# Patient Record
Sex: Male | Born: 1955 | Race: White | Hispanic: No | Marital: Married | Smoking: Never smoker
Health system: Southern US, Community
[De-identification: ages and names within clinical notes are randomized; demographics above are authoritative.]

## PROBLEM LIST (undated history)

## (undated) DIAGNOSIS — K5190 Ulcerative colitis, unspecified, without complications: Secondary | ICD-10-CM

## (undated) DIAGNOSIS — J309 Allergic rhinitis, unspecified: Secondary | ICD-10-CM

## (undated) DIAGNOSIS — K2270 Barrett's esophagus without dysplasia: Secondary | ICD-10-CM

## (undated) DIAGNOSIS — G459 Transient cerebral ischemic attack, unspecified: Secondary | ICD-10-CM

## (undated) DIAGNOSIS — K219 Gastro-esophageal reflux disease without esophagitis: Secondary | ICD-10-CM

## (undated) DIAGNOSIS — E785 Hyperlipidemia, unspecified: Secondary | ICD-10-CM

## (undated) DIAGNOSIS — I10 Essential (primary) hypertension: Secondary | ICD-10-CM

## (undated) HISTORY — PX: VASECTOMY: SHX75

## (undated) HISTORY — PX: COLONOSCOPY: SHX174

## (undated) HISTORY — PX: TONSILLECTOMY: SUR1361

## (undated) HISTORY — PX: ESOPHAGOGASTRODUODENOSCOPY: SHX1529

## (undated) HISTORY — PX: ADENOIDECTOMY: SUR15

---

## 2004-04-27 ENCOUNTER — Ambulatory Visit: Payer: Self-pay | Admitting: Internal Medicine

## 2004-07-20 ENCOUNTER — Ambulatory Visit: Payer: Self-pay | Admitting: Internal Medicine

## 2004-09-23 ENCOUNTER — Ambulatory Visit: Payer: Self-pay | Admitting: Unknown Physician Specialty

## 2004-11-14 ENCOUNTER — Observation Stay: Payer: Self-pay | Admitting: Internal Medicine

## 2004-12-14 ENCOUNTER — Ambulatory Visit: Payer: Self-pay | Admitting: General Practice

## 2004-12-21 ENCOUNTER — Ambulatory Visit: Payer: Self-pay | Admitting: Internal Medicine

## 2005-01-02 ENCOUNTER — Encounter: Payer: Self-pay | Admitting: General Practice

## 2005-04-12 ENCOUNTER — Ambulatory Visit: Payer: Self-pay | Admitting: Internal Medicine

## 2006-02-27 ENCOUNTER — Ambulatory Visit: Payer: Self-pay | Admitting: Internal Medicine

## 2006-06-12 ENCOUNTER — Ambulatory Visit: Payer: Self-pay | Admitting: Internal Medicine

## 2006-07-17 ENCOUNTER — Ambulatory Visit: Payer: Self-pay | Admitting: Internal Medicine

## 2007-04-02 ENCOUNTER — Ambulatory Visit: Payer: Self-pay | Admitting: Internal Medicine

## 2007-04-17 ENCOUNTER — Ambulatory Visit: Payer: Self-pay | Admitting: Unknown Physician Specialty

## 2008-04-03 ENCOUNTER — Ambulatory Visit: Payer: Self-pay | Admitting: Unknown Physician Specialty

## 2009-07-02 ENCOUNTER — Ambulatory Visit: Payer: Self-pay | Admitting: Neurology

## 2009-07-29 ENCOUNTER — Ambulatory Visit: Payer: Self-pay | Admitting: Neurology

## 2010-01-20 ENCOUNTER — Ambulatory Visit: Payer: Self-pay | Admitting: Internal Medicine

## 2010-01-21 LAB — PSA

## 2010-03-14 ENCOUNTER — Ambulatory Visit: Payer: Self-pay | Admitting: Internal Medicine

## 2010-03-21 ENCOUNTER — Other Ambulatory Visit: Payer: Self-pay | Admitting: Internal Medicine

## 2010-03-22 LAB — PSA

## 2010-10-07 IMAGING — US US CAROTID DUPLEX BILAT
1 series · 17 of 24 positions shown · non-contrast
Comparison: none

REASON FOR EXAM: Frontal headache, numbness
COMMENTS:

[Series 1: us carotid duplex bilat · 17 of 62 slices shown]
[im 1/62]
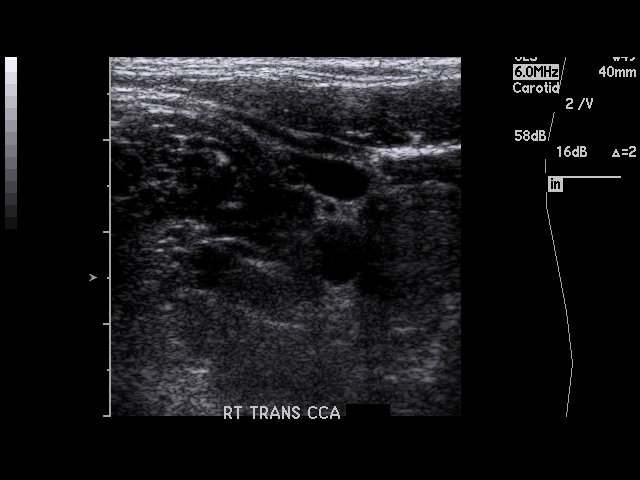
[im 6/62]
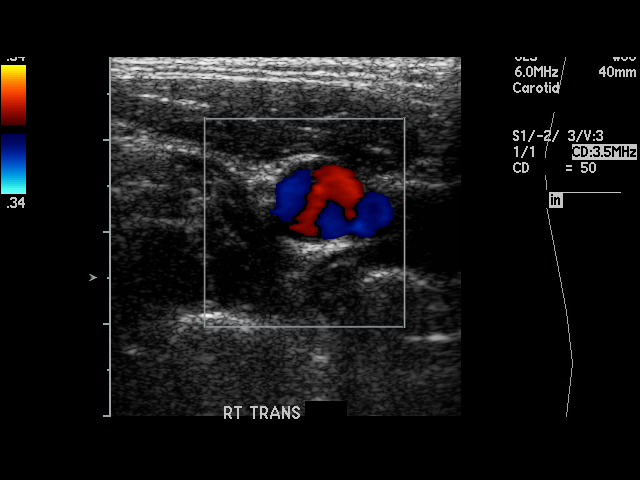
[im 8/62]
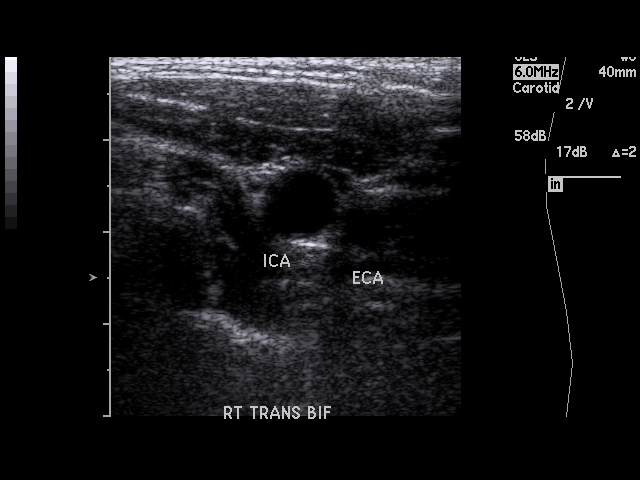
[im 11/62]
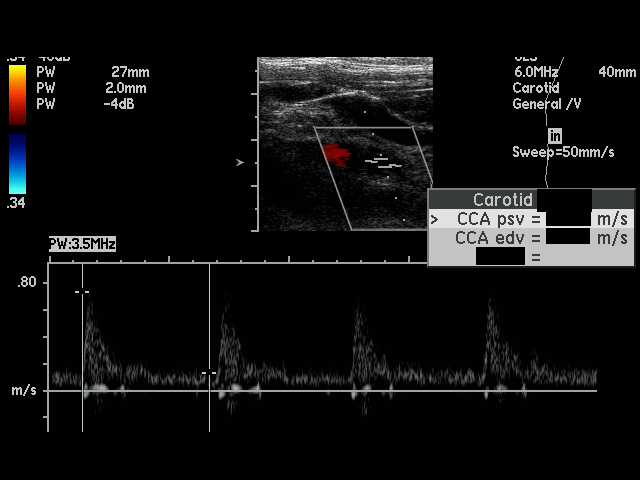
[im 16/62]
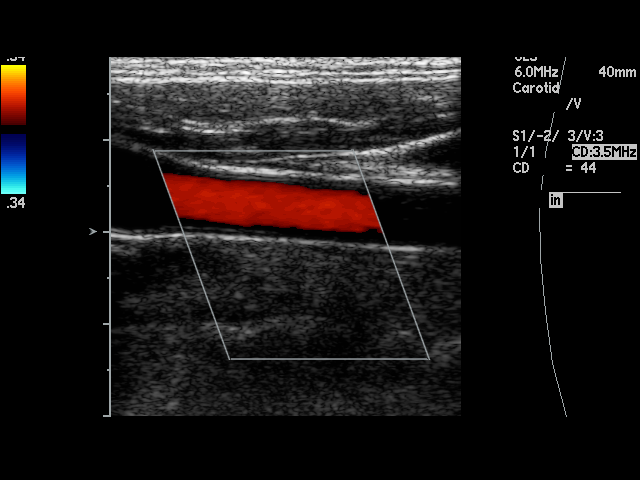
[im 19/62]
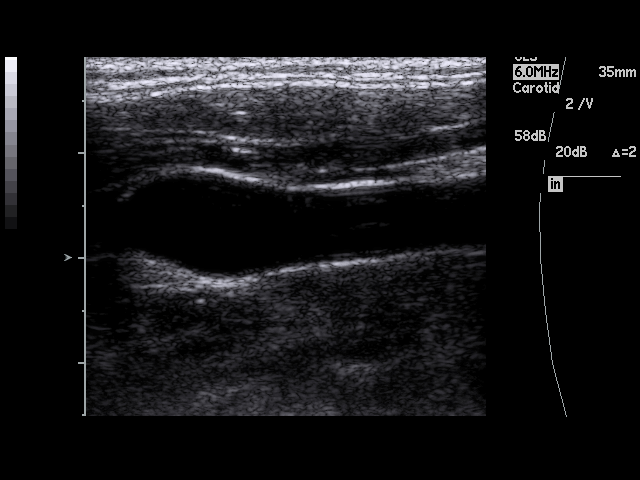
[im 24/62]
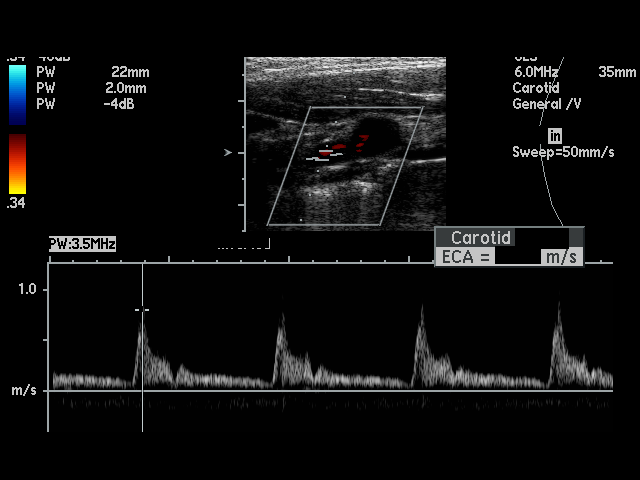
[im 27/62]
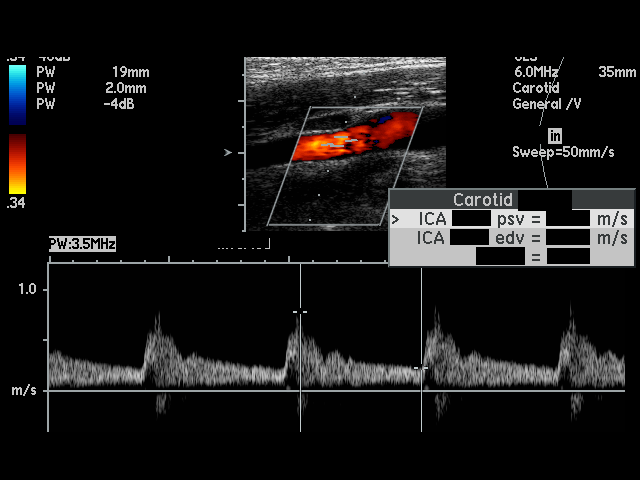
[im 32/62]
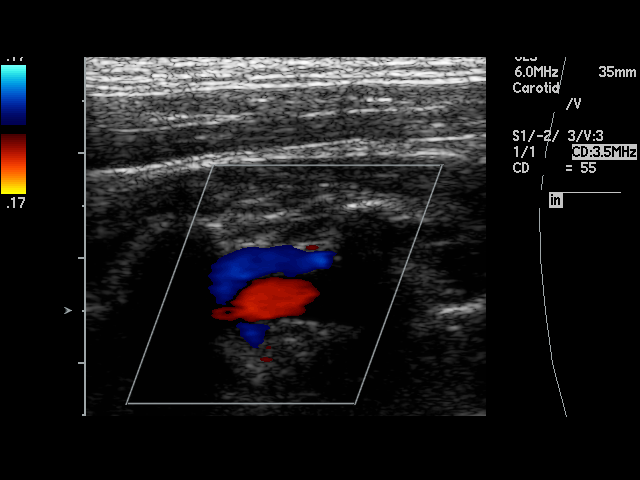
[im 35/62]
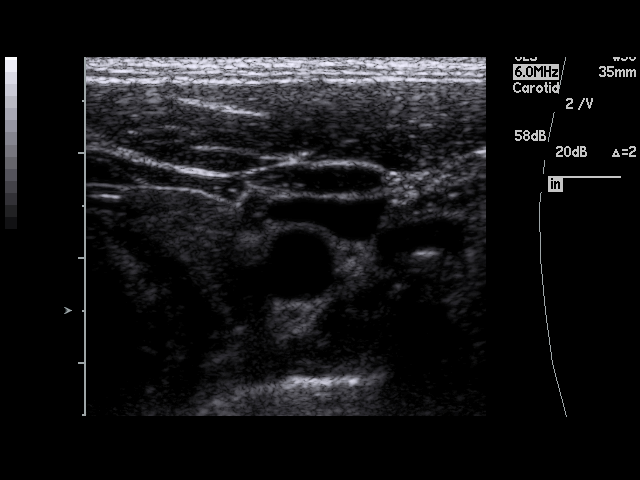
[im 38/62]
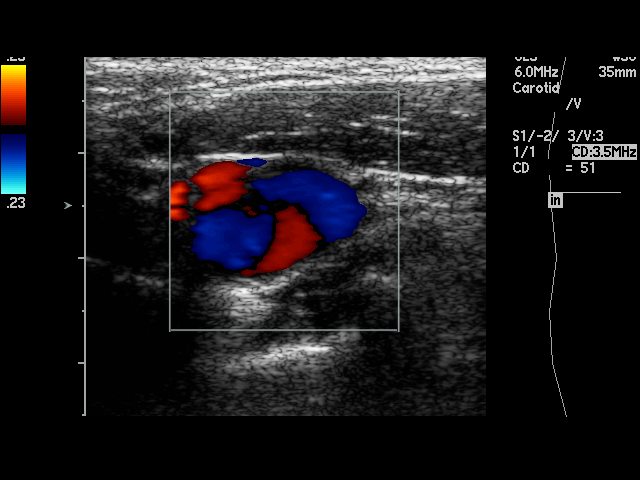
[im 43/62]
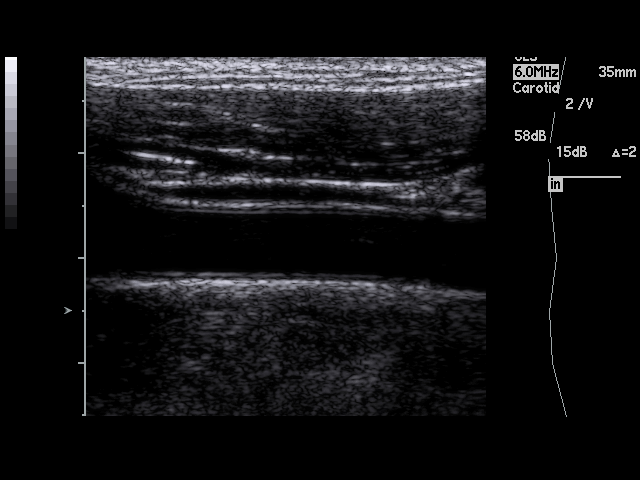
[im 46/62]
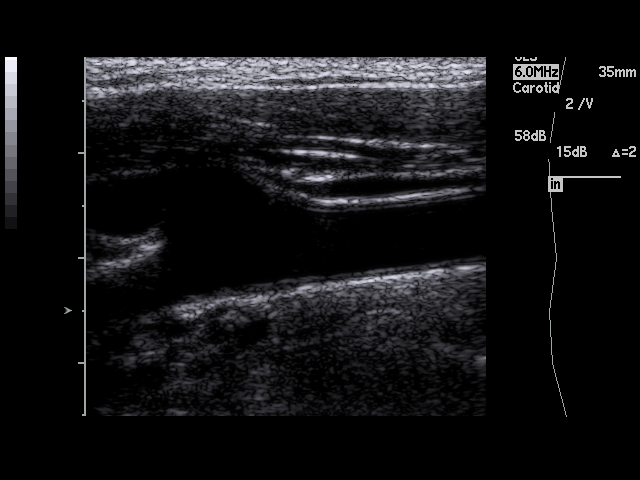
[im 51/62]
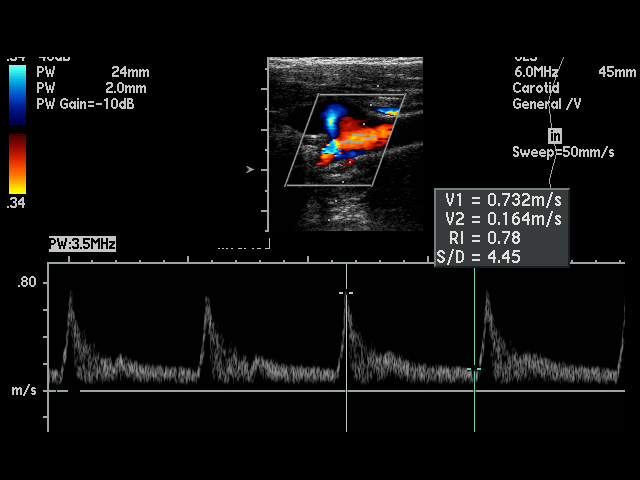
[im 54/62]
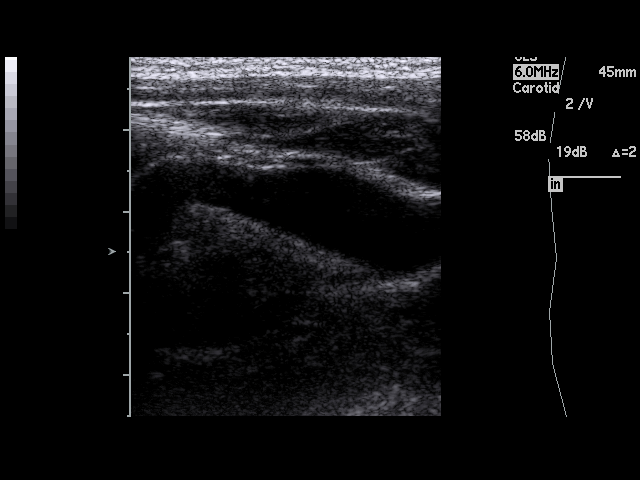
[im 56/62]
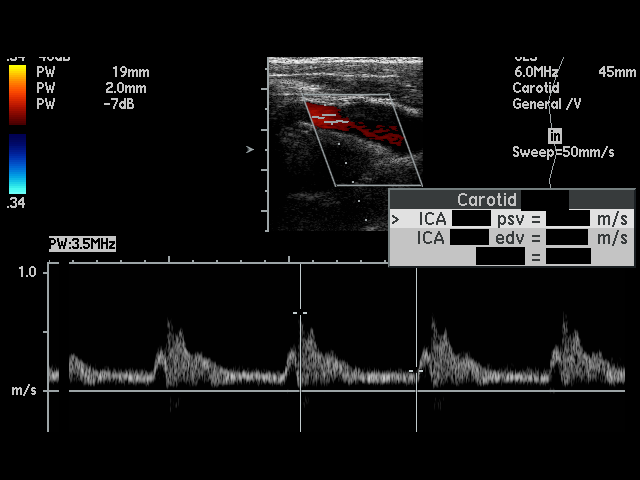
[im 62/62]
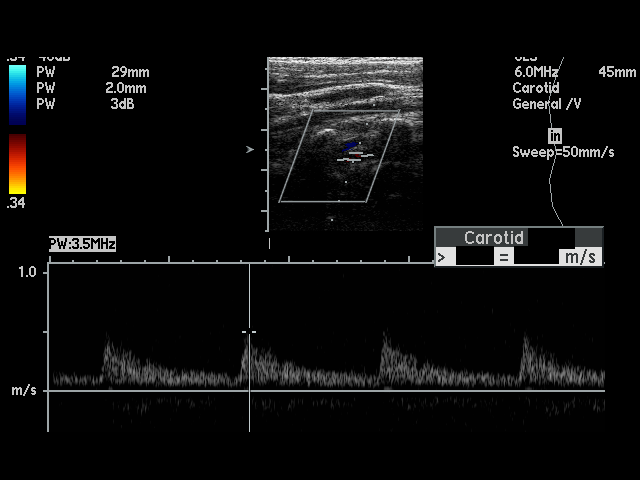

[17 of 24 positions shown; findings below may reference images not displayed]

PROCEDURE:     US  - US CAROTID DOPPLER BILATERAL  - July 29, 2009  [DATE]

RESULT:     Doppler interrogation of the carotid arteries is performed in
the standard fashion. The patient has no previous exam for comparison.

The study shows no significant atherosclerotic plaque formation. There may
be some minimal intimal thickening in the carotid bulb on the left, but no
calcified plaque is seen. No significant stenosis is evident. The Doppler
waveforms appear within normal limits. The peak systolic velocities are
normal. The internal to common carotid peak systolic velocity ratio is
on the right and 1.10 on the left.
IMPRESSION: No evidence of hemodynamically significant stenosis.

## 2011-03-22 ENCOUNTER — Other Ambulatory Visit: Payer: Self-pay | Admitting: Internal Medicine

## 2011-03-31 ENCOUNTER — Ambulatory Visit: Payer: Self-pay | Admitting: Unknown Physician Specialty

## 2012-04-22 ENCOUNTER — Other Ambulatory Visit: Payer: Self-pay | Admitting: Internal Medicine

## 2012-04-22 LAB — COMPREHENSIVE METABOLIC PANEL
Albumin: 4.5 g/dL (ref 3.4–5.0)
Alkaline Phosphatase: 82 U/L (ref 50–136)
Bilirubin,Total: 0.8 mg/dL (ref 0.2–1.0)
Co2: 28 mmol/L (ref 21–32)
Creatinine: 1.25 mg/dL (ref 0.60–1.30)
EGFR (Non-African Amer.): >60
Potassium: 3.9 mmol/L (ref 3.5–5.1)
SGPT (ALT): 41 U/L (ref 12–78)

## 2012-04-22 LAB — CBC WITH DIFFERENTIAL/PLATELET
Basophil %: 0.4 %
Eosinophil #: 0.2 10*3/uL (ref 0.0–0.7)
Lymphocyte #: 2.4 10*3/uL (ref 1.0–3.6)
Lymphocyte %: 26.8 %
Monocyte #: 0.8 x10 3/mm (ref 0.2–1.0)
Monocyte %: 8.6 %
Neutrophil #: 5.6 10*3/uL (ref 1.4–6.5)
Platelet: 286 10*3/uL (ref 150–440)
RBC: 5.3 10*6/uL (ref 4.40–5.90)
RDW: 13.6 % (ref 11.5–14.5)
WBC: 9 10*3/uL (ref 3.8–10.6)

## 2012-04-22 LAB — LIPID PANEL
Cholesterol: 158 mg/dL (ref 0–200)
HDL Cholesterol: 52 mg/dL (ref 40–60)
VLDL Cholesterol, Calc: 36 mg/dL (ref 5–40)

## 2012-04-22 LAB — URINALYSIS, COMPLETE
Bilirubin,UR: NEGATIVE
Leukocyte Esterase: NEGATIVE
Nitrite: NEGATIVE
Ph: 6 (ref 4.5–8.0)
Specific Gravity: 1.026 (ref 1.003–1.030)
Squamous Epithelial: NONE SEEN

## 2012-04-23 LAB — PSA: PSA: 3.1 ng/mL (ref 0.0–4.0)

## 2013-06-12 ENCOUNTER — Ambulatory Visit: Payer: Self-pay | Admitting: Internal Medicine

## 2013-06-12 LAB — COMPREHENSIVE METABOLIC PANEL
ALK PHOS: 76 U/L
Albumin: 4.3 g/dL (ref 3.4–5.0)
Anion Gap: 10 (ref 7–16)
BUN: 20 mg/dL — ABNORMAL HIGH (ref 7–18)
Bilirubin,Total: 0.7 mg/dL (ref 0.2–1.0)
CALCIUM: 8.5 mg/dL (ref 8.5–10.1)
CREATININE: 1.38 mg/dL — AB (ref 0.60–1.30)
Chloride: 103 mmol/L (ref 98–107)
Co2: 27 mmol/L (ref 21–32)
EGFR (African American): >60
GFR CALC NON AF AMER: 56 — AB
Glucose: 93 mg/dL (ref 65–99)
Osmolality: 282 (ref 275–301)
POTASSIUM: 3.7 mmol/L (ref 3.5–5.1)
SGOT(AST): 22 U/L (ref 15–37)
SGPT (ALT): 33 U/L (ref 12–78)
SODIUM: 140 mmol/L (ref 136–145)
Total Protein: 8.3 g/dL — ABNORMAL HIGH (ref 6.4–8.2)

## 2013-06-12 LAB — CBC WITH DIFFERENTIAL/PLATELET
BASOS PCT: 0.2 %
Basophil #: 0 10*3/uL (ref 0.0–0.1)
Eosinophil #: 0.3 10*3/uL (ref 0.0–0.7)
Eosinophil %: 3.8 %
HCT: 47.4 % (ref 40.0–52.0)
HGB: 15.5 g/dL (ref 13.0–18.0)
LYMPHS PCT: 33.7 %
Lymphocyte #: 2.9 10*3/uL (ref 1.0–3.6)
MCH: 29.5 pg (ref 26.0–34.0)
MCHC: 32.8 g/dL (ref 32.0–36.0)
MCV: 90 fL (ref 80–100)
Monocyte #: 0.7 x10 3/mm (ref 0.2–1.0)
Monocyte %: 8.6 %
Neutrophil #: 4.6 10*3/uL (ref 1.4–6.5)
Neutrophil %: 53.7 %
Platelet: 261 10*3/uL (ref 150–440)
RBC: 5.27 10*6/uL (ref 4.40–5.90)
RDW: 13.6 % (ref 11.5–14.5)
WBC: 8.6 10*3/uL (ref 3.8–10.6)

## 2013-06-12 LAB — URINALYSIS, COMPLETE
BILIRUBIN, UR: NEGATIVE
Bacteria: NONE SEEN
GLUCOSE, UR: NEGATIVE mg/dL (ref 0–75)
KETONE: NEGATIVE
LEUKOCYTE ESTERASE: NEGATIVE
NITRITE: NEGATIVE
PH: 6 (ref 4.5–8.0)
PROTEIN: NEGATIVE
RBC,UR: 2 /HPF (ref 0–5)
Specific Gravity: 1.023 (ref 1.003–1.030)
WBC UR: 1 /HPF (ref 0–5)

## 2013-06-12 LAB — LIPID PANEL
Cholesterol: 134 mg/dL (ref 0–200)
HDL: 50 mg/dL (ref 40–60)
Ldl Cholesterol, Calc: 69 mg/dL (ref 0–100)
TRIGLYCERIDES: 77 mg/dL (ref 0–200)
VLDL CHOLESTEROL, CALC: 15 mg/dL (ref 5–40)

## 2013-06-12 LAB — HEMOGLOBIN A1C: Hemoglobin A1C: 5.7 % (ref 4.2–6.3)

## 2013-06-12 LAB — TSH: Thyroid Stimulating Horm: 1.88 u[IU]/mL

## 2013-06-12 LAB — URIC ACID: URIC ACID: 5.3 mg/dL (ref 3.5–7.2)

## 2013-06-13 LAB — PSA: PSA: 2 ng/mL (ref 0.0–4.0)

## 2014-04-23 ENCOUNTER — Emergency Department: Payer: Self-pay | Admitting: Emergency Medicine

## 2014-04-23 LAB — URINALYSIS, COMPLETE
BACTERIA: NONE SEEN
Bilirubin,UR: NEGATIVE
GLUCOSE, UR: NEGATIVE mg/dL (ref 0–75)
Ketone: NEGATIVE
LEUKOCYTE ESTERASE: NEGATIVE
Nitrite: NEGATIVE
Ph: 7 (ref 4.5–8.0)
Protein: NEGATIVE
RBC,UR: 1 /HPF (ref 0–5)
SPECIFIC GRAVITY: 1.006 (ref 1.003–1.030)
Squamous Epithelial: NONE SEEN
WBC UR: <1 /HPF (ref 0–5)

## 2014-04-23 LAB — COMPREHENSIVE METABOLIC PANEL
ALK PHOS: 72 U/L
ANION GAP: 8 (ref 7–16)
AST: 29 U/L (ref 15–37)
Albumin: 4.2 g/dL (ref 3.4–5.0)
BILIRUBIN TOTAL: 0.8 mg/dL (ref 0.2–1.0)
BUN: 15 mg/dL (ref 7–18)
CO2: 28 mmol/L (ref 21–32)
Calcium, Total: 8.8 mg/dL (ref 8.5–10.1)
Chloride: 106 mmol/L (ref 98–107)
Creatinine: 1.39 mg/dL — ABNORMAL HIGH (ref 0.60–1.30)
EGFR (Non-African Amer.): 55 — ABNORMAL LOW
GLUCOSE: 117 mg/dL — AB (ref 65–99)
Osmolality: 285 (ref 275–301)
Potassium: 4 mmol/L (ref 3.5–5.1)
SGPT (ALT): 41 U/L
SODIUM: 142 mmol/L (ref 136–145)
Total Protein: 8 g/dL (ref 6.4–8.2)

## 2014-04-23 LAB — CBC
HCT: 49 % (ref 40.0–52.0)
HGB: 15.9 g/dL (ref 13.0–18.0)
MCH: 29.8 pg (ref 26.0–34.0)
MCHC: 32.4 g/dL (ref 32.0–36.0)
MCV: 92 fL (ref 80–100)
Platelet: 255 10*3/uL (ref 150–440)
RBC: 5.33 10*6/uL (ref 4.40–5.90)
RDW: 14 % (ref 11.5–14.5)
WBC: 7.9 10*3/uL (ref 3.8–10.6)

## 2014-07-09 ENCOUNTER — Ambulatory Visit: Payer: Self-pay | Admitting: Unknown Physician Specialty

## 2014-07-17 ENCOUNTER — Ambulatory Visit: Payer: Self-pay | Admitting: Unknown Physician Specialty

## 2014-09-14 LAB — SURGICAL PATHOLOGY

## 2014-12-30 ENCOUNTER — Other Ambulatory Visit
Admission: RE | Admit: 2014-12-30 | Discharge: 2014-12-30 | Disposition: A | Discharge status: Home / Self Care | Payer: 59 | Source: Ambulatory Visit | Attending: Internal Medicine | Admitting: Internal Medicine

## 2014-12-30 DIAGNOSIS — R972 Elevated prostate specific antigen [PSA]: Secondary | ICD-10-CM | POA: Diagnosis not present

## 2014-12-30 LAB — COMPREHENSIVE METABOLIC PANEL
ALBUMIN: 4.7 g/dL (ref 3.5–5.0)
ALT: 26 U/L (ref 17–63)
ANION GAP: 9 (ref 5–15)
AST: 27 U/L (ref 15–41)
Alkaline Phosphatase: 63 U/L (ref 38–126)
BUN: 16 mg/dL (ref 6–20)
CO2: 26 mmol/L (ref 22–32)
Calcium: 9.2 mg/dL (ref 8.9–10.3)
Chloride: 104 mmol/L (ref 101–111)
Creatinine, Ser: 1.49 mg/dL — ABNORMAL HIGH (ref 0.61–1.24)
GFR calc Af Amer: 57 mL/min — ABNORMAL LOW (ref >60)
GFR calc non Af Amer: 49 mL/min — ABNORMAL LOW (ref >60)
Glucose, Bld: 101 mg/dL — ABNORMAL HIGH (ref 65–99)
POTASSIUM: 4 mmol/L (ref 3.5–5.1)
SODIUM: 139 mmol/L (ref 135–145)
TOTAL PROTEIN: 8 g/dL (ref 6.5–8.1)
Total Bilirubin: 0.7 mg/dL (ref 0.3–1.2)

## 2014-12-30 LAB — CBC
HEMATOCRIT: 48.9 % (ref 40.0–52.0)
HEMOGLOBIN: 16 g/dL (ref 13.0–18.0)
MCH: 29.3 pg (ref 26.0–34.0)
MCHC: 32.7 g/dL (ref 32.0–36.0)
MCV: 89.6 fL (ref 80.0–100.0)
PLATELETS: 265 10*3/uL (ref 150–440)
RBC: 5.46 MIL/uL (ref 4.40–5.90)
RDW: 14.5 % (ref 11.5–14.5)
WBC: 8.2 10*3/uL (ref 3.8–10.6)

## 2014-12-30 LAB — URINALYSIS COMPLETE WITH MICROSCOPIC (ARMC ONLY)
BACTERIA UA: NONE SEEN
BILIRUBIN URINE: NEGATIVE
Glucose, UA: NEGATIVE mg/dL
HGB URINE DIPSTICK: NEGATIVE
Ketones, ur: NEGATIVE mg/dL
Leukocytes, UA: NEGATIVE
NITRITE: NEGATIVE
PH: 5 (ref 5.0–8.0)
PROTEIN: NEGATIVE mg/dL
Specific Gravity, Urine: 1.026 (ref 1.005–1.030)

## 2014-12-30 LAB — LIPID PANEL
Cholesterol: 164 mg/dL (ref 0–200)
HDL: 51 mg/dL (ref >40)
LDL CALC: 96 mg/dL (ref 0–99)
TRIGLYCERIDES: 83 mg/dL (ref <150)
Total CHOL/HDL Ratio: 3.2 RATIO
VLDL: 17 mg/dL (ref 0–40)

## 2014-12-30 LAB — SEDIMENTATION RATE: SED RATE: 3 mm/h (ref 0–20)

## 2014-12-30 LAB — TSH: TSH: 1.74 u[IU]/mL (ref 0.350–4.500)

## 2014-12-30 LAB — HEMOGLOBIN A1C: HEMOGLOBIN A1C: 5.5 % (ref 4.0–6.0)

## 2014-12-30 LAB — PSA: PSA: 3.03 ng/mL (ref 0.00–4.00)

## 2015-03-29 ENCOUNTER — Other Ambulatory Visit
Admission: RE | Admit: 2015-03-29 | Discharge: 2015-03-29 | Disposition: A | Discharge status: Home / Self Care | Payer: 59 | Source: Ambulatory Visit | Attending: Internal Medicine | Admitting: Internal Medicine

## 2015-03-29 DIAGNOSIS — R972 Elevated prostate specific antigen [PSA]: Secondary | ICD-10-CM | POA: Insufficient documentation

## 2015-03-29 LAB — PSA: PSA: 1.8 ng/mL (ref 0.00–4.00)

## 2015-08-10 DIAGNOSIS — Z872 Personal history of diseases of the skin and subcutaneous tissue: Secondary | ICD-10-CM | POA: Diagnosis not present

## 2015-08-10 DIAGNOSIS — Z85828 Personal history of other malignant neoplasm of skin: Secondary | ICD-10-CM | POA: Diagnosis not present

## 2015-08-10 DIAGNOSIS — Z08 Encounter for follow-up examination after completed treatment for malignant neoplasm: Secondary | ICD-10-CM | POA: Diagnosis not present

## 2015-08-10 DIAGNOSIS — Z1283 Encounter for screening for malignant neoplasm of skin: Secondary | ICD-10-CM | POA: Diagnosis not present

## 2015-10-21 DIAGNOSIS — Z23 Encounter for immunization: Secondary | ICD-10-CM | POA: Diagnosis not present

## 2016-01-12 ENCOUNTER — Other Ambulatory Visit
Admission: RE | Admit: 2016-01-12 | Discharge: 2016-01-12 | Disposition: A | Discharge status: Home / Self Care | Payer: 59 | Source: Ambulatory Visit | Attending: Internal Medicine | Admitting: Internal Medicine

## 2016-01-12 DIAGNOSIS — I10 Essential (primary) hypertension: Secondary | ICD-10-CM | POA: Insufficient documentation

## 2016-01-12 LAB — PSA: PSA: 2.84 ng/mL (ref 0.00–4.00)

## 2016-01-12 LAB — CBC
HEMATOCRIT: 46.1 % (ref 40.0–52.0)
HEMOGLOBIN: 15.7 g/dL (ref 13.0–18.0)
MCH: 30.3 pg (ref 26.0–34.0)
MCHC: 33.9 g/dL (ref 32.0–36.0)
MCV: 89.2 fL (ref 80.0–100.0)
Platelets: 217 10*3/uL (ref 150–440)
RBC: 5.17 MIL/uL (ref 4.40–5.90)
RDW: 13.8 % (ref 11.5–14.5)
WBC: 8.4 10*3/uL (ref 3.8–10.6)

## 2016-01-12 LAB — VITAMIN B12: VITAMIN B 12: 204 pg/mL (ref 180–914)

## 2016-01-12 LAB — LIPID PANEL
Cholesterol: 152 mg/dL (ref 0–200)
HDL: 50 mg/dL (ref >40)
LDL CALC: 85 mg/dL (ref 0–99)
Total CHOL/HDL Ratio: 3 RATIO
Triglycerides: 86 mg/dL (ref <150)
VLDL: 17 mg/dL (ref 0–40)

## 2016-01-12 LAB — URINALYSIS COMPLETE WITH MICROSCOPIC (ARMC ONLY)
BACTERIA UA: NONE SEEN
BILIRUBIN URINE: NEGATIVE
GLUCOSE, UA: NEGATIVE mg/dL
Ketones, ur: NEGATIVE mg/dL
LEUKOCYTES UA: NEGATIVE
Nitrite: NEGATIVE
PH: 5 (ref 5.0–8.0)
Protein, ur: NEGATIVE mg/dL
Specific Gravity, Urine: 1.023 (ref 1.005–1.030)
Squamous Epithelial / LPF: NONE SEEN

## 2016-01-12 LAB — COMPREHENSIVE METABOLIC PANEL
ALT: 23 U/L (ref 17–63)
AST: 21 U/L (ref 15–41)
Albumin: 4.2 g/dL (ref 3.5–5.0)
Alkaline Phosphatase: 53 U/L (ref 38–126)
Anion gap: 7 (ref 5–15)
BUN: 15 mg/dL (ref 6–20)
CO2: 29 mmol/L (ref 22–32)
CREATININE: 1.31 mg/dL — AB (ref 0.61–1.24)
Calcium: 9 mg/dL (ref 8.9–10.3)
Chloride: 106 mmol/L (ref 101–111)
GFR, EST NON AFRICAN AMERICAN: 57 mL/min — AB (ref >60)
Glucose, Bld: 100 mg/dL — ABNORMAL HIGH (ref 65–99)
Potassium: 3.8 mmol/L (ref 3.5–5.1)
SODIUM: 142 mmol/L (ref 135–145)
Total Bilirubin: 1.2 mg/dL (ref 0.3–1.2)
Total Protein: 7.4 g/dL (ref 6.5–8.1)

## 2016-01-12 LAB — TSH: TSH: 2.348 u[IU]/mL (ref 0.350–4.500)

## 2016-01-12 LAB — HEMOGLOBIN A1C: Hgb A1c MFr Bld: 5.6 % (ref 4.0–6.0)

## 2016-01-13 DIAGNOSIS — Z0000 Encounter for general adult medical examination without abnormal findings: Secondary | ICD-10-CM | POA: Diagnosis not present

## 2016-05-11 DIAGNOSIS — Z23 Encounter for immunization: Secondary | ICD-10-CM | POA: Diagnosis not present

## 2016-05-18 ENCOUNTER — Encounter: Payer: Self-pay | Admitting: *Deleted

## 2016-05-19 ENCOUNTER — Ambulatory Visit: Payer: 59 | Admitting: Certified Registered Nurse Anesthetist

## 2016-05-19 ENCOUNTER — Encounter: Payer: Self-pay | Admitting: Anesthesiology

## 2016-05-19 ENCOUNTER — Ambulatory Visit
Admission: RE | Admit: 2016-05-19 | Discharge: 2016-05-19 | Disposition: A | Discharge status: Home / Self Care | Payer: 59 | Source: Ambulatory Visit | Attending: Unknown Physician Specialty | Admitting: Unknown Physician Specialty

## 2016-05-19 ENCOUNTER — Encounter
Admission: RE | Disposition: A | Discharge status: Home / Self Care | Payer: Self-pay | Source: Ambulatory Visit | Attending: Unknown Physician Specialty

## 2016-05-19 DIAGNOSIS — K2270 Barrett's esophagus without dysplasia: Secondary | ICD-10-CM | POA: Insufficient documentation

## 2016-05-19 DIAGNOSIS — E785 Hyperlipidemia, unspecified: Secondary | ICD-10-CM | POA: Diagnosis not present

## 2016-05-19 DIAGNOSIS — K209 Esophagitis, unspecified: Secondary | ICD-10-CM | POA: Diagnosis not present

## 2016-05-19 DIAGNOSIS — Z7982 Long term (current) use of aspirin: Secondary | ICD-10-CM | POA: Insufficient documentation

## 2016-05-19 DIAGNOSIS — K3189 Other diseases of stomach and duodenum: Secondary | ICD-10-CM | POA: Diagnosis not present

## 2016-05-19 DIAGNOSIS — K2960 Other gastritis without bleeding: Secondary | ICD-10-CM | POA: Diagnosis not present

## 2016-05-19 DIAGNOSIS — K449 Diaphragmatic hernia without obstruction or gangrene: Secondary | ICD-10-CM | POA: Diagnosis not present

## 2016-05-19 DIAGNOSIS — Z8673 Personal history of transient ischemic attack (TIA), and cerebral infarction without residual deficits: Secondary | ICD-10-CM | POA: Diagnosis not present

## 2016-05-19 DIAGNOSIS — I10 Essential (primary) hypertension: Secondary | ICD-10-CM | POA: Diagnosis not present

## 2016-05-19 DIAGNOSIS — K210 Gastro-esophageal reflux disease with esophagitis: Secondary | ICD-10-CM | POA: Diagnosis not present

## 2016-05-19 DIAGNOSIS — K2950 Unspecified chronic gastritis without bleeding: Secondary | ICD-10-CM | POA: Diagnosis not present

## 2016-05-19 DIAGNOSIS — K2940 Chronic atrophic gastritis without bleeding: Secondary | ICD-10-CM | POA: Diagnosis not present

## 2016-05-19 DIAGNOSIS — K5190 Ulcerative colitis, unspecified, without complications: Secondary | ICD-10-CM | POA: Insufficient documentation

## 2016-05-19 DIAGNOSIS — K317 Polyp of stomach and duodenum: Secondary | ICD-10-CM | POA: Diagnosis not present

## 2016-05-19 HISTORY — DX: Barrett's esophagus without dysplasia: K22.70

## 2016-05-19 HISTORY — DX: Gastro-esophageal reflux disease without esophagitis: K21.9

## 2016-05-19 HISTORY — PX: ESOPHAGOGASTRODUODENOSCOPY (EGD) WITH PROPOFOL: SHX5813

## 2016-05-19 HISTORY — DX: Essential (primary) hypertension: I10

## 2016-05-19 HISTORY — DX: Allergic rhinitis, unspecified: J30.9

## 2016-05-19 HISTORY — DX: Ulcerative colitis, unspecified, without complications: K51.90

## 2016-05-19 HISTORY — DX: Transient cerebral ischemic attack, unspecified: G45.9

## 2016-05-19 HISTORY — DX: Hyperlipidemia, unspecified: E78.5

## 2016-05-19 SURGERY — ESOPHAGOGASTRODUODENOSCOPY (EGD) WITH PROPOFOL
Anesthesia: General

## 2016-05-19 MED ORDER — SODIUM CHLORIDE 0.9 % IV SOLN
INTRAVENOUS | Status: DC
  Administered 2016-05-19: 1000 mL via INTRAVENOUS

## 2016-05-19 MED ORDER — PROPOFOL 500 MG/50ML IV EMUL
INTRAVENOUS | Status: DC | PRN
  Administered 2016-05-19: 125 ug/kg/min via INTRAVENOUS

## 2016-05-19 MED ORDER — SODIUM CHLORIDE 0.9 % IV SOLN
INTRAVENOUS | Status: DC
  Administered 2016-05-19: 08:00:00 via INTRAVENOUS

## 2016-05-19 MED ORDER — PROPOFOL 10 MG/ML IV BOLUS
INTRAVENOUS | Status: AC
  Filled 2016-05-19: qty 20

## 2016-05-19 MED ORDER — LIDOCAINE 2% (20 MG/ML) 5 ML SYRINGE
INTRAMUSCULAR | Status: AC
  Filled 2016-05-19: qty 5

## 2016-05-19 MED ORDER — GLYCOPYRROLATE 0.2 MG/ML IJ SOLN
INTRAMUSCULAR | Status: AC
  Filled 2016-05-19: qty 1

## 2016-05-19 MED ORDER — PROPOFOL 500 MG/50ML IV EMUL
INTRAVENOUS | Status: AC
  Filled 2016-05-19: qty 50

## 2016-05-19 MED ORDER — GLYCOPYRROLATE 0.2 MG/ML IJ SOLN
INTRAMUSCULAR | Status: DC | PRN
  Administered 2016-05-19: 0.2 mg via INTRAVENOUS

## 2016-05-19 MED ORDER — PROPOFOL 10 MG/ML IV BOLUS
INTRAVENOUS | Status: DC | PRN
  Administered 2016-05-19: 50 mg via INTRAVENOUS
  Administered 2016-05-19: 30 mg via INTRAVENOUS
  Administered 2016-05-19: 50 mg via INTRAVENOUS

## 2016-05-19 NOTE — Op Note (Signed)
[facility] Gastroenterology Patient Name: Robert GutterRobert Barnes Procedure Date: 05/19/2016 8:03 AM MRN: [id number] Account #: [id number] Date of Birth: Feb 22, 1956 Admit Type: Outpatient Age: [id number] [facility] ROOM 1 Gender: Male Note Status: Finalized Procedure:            Upper GI endoscopy Indications:          Follow-up of Barrett's esophagus Providers:            Scot Junobert T. Elliott, MD Referring MD:         Danella PentonMark F. Miller, MD (Referring MD) Medicines:            Propofol per Anesthesia Complications:        No immediate complications. Procedure:            Pre-Anesthesia Assessment:                       - After reviewing the risks and benefits, the patient                        was deemed in satisfactory condition to undergo the                        procedure.                       After obtaining informed consent, the endoscope was                        passed under direct vision. Throughout the procedure,                        the patient's blood pressure, pulse, and oxygen                        saturations were monitored continuously. The Endoscope                        was introduced through the mouth, and advanced to the                        second part of duodenum. The upper GI endoscopy was                        accomplished without difficulty. The patient tolerated                        the procedure well. Findings:      Non-severe esophagitis with no bleeding was found 39 cm from the       incisors. Biopsies were taken with a cold forceps for histology.      A small hiatal hernia was present.      Multiple small semi-sessile polyps with no bleeding and no stigmata of       recent bleeding were found in the gastric body.      Striped mildly erythematous mucosa without bleeding was found in the       gastric antrum. Biopsies were taken with a cold forceps for histology.      The examined duodenum was normal. Impression:           - Non-severe  reflux esophagitis. Rule out Barrett's  esophagus. Biopsied.                       - Small hiatal hernia.                       - Multiple gastric polyps.                       - Erythematous mucosa in the antrum. Biopsied.                       - Normal examined duodenum. Recommendation:       - Await pathology results. Soft diet 2 days, OK to                        resume asprin if needed. Scot Junobert T Elliott, MD 05/19/2016 8:28:28 AM This report has been signed electronically. Number of Addenda: 0 Note Initiated On: 05/19/2016 8:03 AM      [facility]

## 2016-05-19 NOTE — H&P (Signed)
   Primary Care Physician:  Danella PentonMark F Miller, MD Primary Gastroenterologist:  Dr. Mechele CollinElliott  Pre-Procedure History & Physical: HPI:  Robert RobertsRobert G Barnes is a 60 y.o. male is here for an  endoscopy.   Past Medical History:  Diagnosis Date  . Allergic rhinitis   . Barrett's esophagus   . GERD (gastroesophageal reflux disease)   . Hyperlipidemia   . Hypertension   . TIA (transient ischemic attack)   . Ulcerative colitis [facility](H[facility])     Past Surgical History:  Procedure Laterality Date  . ADENOIDECTOMY    . COLONOSCOPY    . ESOPHAGOGASTRODUODENOSCOPY    . TONSILLECTOMY    . VASECTOMY      Prior to Admission medications   Medication Sig Start Date End Date Taking? Authorizing Provider  amLODipine (NORVASC) 5 MG tablet Take 5 mg by mouth daily.   Yes Historical Provider, MD  aspirin EC 81 MG tablet Take 81 mg by mouth daily.   Yes Historical Provider, MD  atorvastatin (LIPITOR) 20 MG tablet Take 20 mg by mouth daily.   Yes Historical Provider, MD  bisoprolol (ZEBETA) 10 MG tablet Take 10 mg by mouth daily.   Yes Historical Provider, MD  Cholecalciferol 3000 units TABS Take 1 tablet by mouth daily.   Yes Historical Provider, MD  docusate sodium (COLACE) 100 MG capsule Take 100 mg by mouth daily.   Yes Historical Provider, MD  lansoprazole (PREVACID) 30 MG capsule Take 30 mg by mouth daily at 12 noon.   Yes Historical Provider, MD    Allergies as of 05/09/2016  . (Not on File)    History reviewed. No pertinent family history.  Social History   Social History  . Marital status: Married    Spouse name: N/A  . Number of children: N/A  . Years of education: N/A   Occupational History  . Not on file.   Social History Main Topics  . Smoking status: Never Smoker  . Smokeless tobacco: Never Used  . Alcohol use Not on file  . Drug use: Unknown  . Sexual activity: Not on file   Other Topics Concern  . Not on file   Social History Narrative  . No narrative on file    Review of  Systems: See HPI, otherwise negative ROS  Physical Exam: BP (!) 145/91   Pulse (!) 53   Temp (!) 96.7 F (35.9 C) (Tympanic)   Resp 16   Ht 5\' 10"  (1.778 m)   Wt 81.6 kg (180 lb)   SpO2 99%   BMI 25.83 kg/m  General:   Alert,  pleasant and cooperative in NAD Head:  Normocephalic and atraumatic. Neck:  Supple; no masses or thyromegaly. Lungs:  Clear throughout to auscultation.    Heart:  Regular rate and rhythm. Abdomen:  Soft, nontender and nondistended. Normal bowel sounds, without guarding, and without rebound.   Neurologic:  Alert and  oriented x4;  grossly normal neurologically.  Impression/Plan: Robert RobertsRobert G Barnes is here for an endoscopy to be performed for Barretts esophagus  Risks, benefits, limitations, and alternatives regarding  endoscopy have been reviewed with the patient.  Questions have been answered.  All parties agreeable.   Lynnae PrudeELLIOTT, [name], MD  05/19/2016, 8:07 AM

## 2016-05-19 NOTE — Anesthesia Postprocedure Evaluation (Signed)
Anesthesia Post Note  Patient: Robert RobertsRobert G Baba  Procedure(s) Performed: Procedure(s) (LRB): ESOPHAGOGASTRODUODENOSCOPY (EGD) WITH PROPOFOL (N/A)  Patient location during evaluation: Endoscopy Anesthesia Type: General Level of consciousness: awake and alert Pain management: pain level controlled Vital Signs Assessment: post-procedure vital signs reviewed and stable Respiratory status: spontaneous breathing, nonlabored ventilation, respiratory function stable and patient connected to nasal cannula oxygen Cardiovascular status: blood pressure returned to baseline and stable Postop Assessment: no signs of nausea or vomiting Anesthetic complications: no     Last Vitals:  Vitals:   05/19/16 0849 05/19/16 0859  BP: 98/72 120/82  Pulse: (!) 58 (!) 56  Resp: 12 13  Temp:      Last Pain:  Vitals:   05/19/16 0829  TempSrc: Tympanic                 Navpreet Szczygiel S

## 2016-05-19 NOTE — Anesthesia Preprocedure Evaluation (Signed)
Anesthesia Evaluation  Patient identified by MRN, date of birth, ID band Patient awake    Reviewed: Allergy & Precautions, NPO status , Patient's Chart, lab work & pertinent test results, reviewed documented beta blocker date and time   Airway Mallampati: II  TM Distance: >3 FB     Dental  (+) Chipped   Pulmonary           Cardiovascular hypertension, Pt. on medications      Neuro/Psych TIA   GI/Hepatic PUD, GERD  Controlled,  Endo/Other    Renal/GU      Musculoskeletal   Abdominal   Peds  Hematology   Anesthesia Other Findings   Reproductive/Obstetrics                             Anesthesia Physical Anesthesia Plan  ASA: III  Anesthesia Plan: General   Post-op Pain Management:    Induction: Intravenous  Airway Management Planned: Nasal Cannula  Additional Equipment:   Intra-op Plan:   Post-operative Plan:   Informed Consent: I have reviewed the patients History and Physical, chart, labs and discussed the procedure including the risks, benefits and alternatives for the proposed anesthesia with the patient or authorized representative who has indicated his/her understanding and acceptance.     Plan Discussed with: CRNA  Anesthesia Plan Comments:         Anesthesia Quick Evaluation

## 2016-05-19 NOTE — Transfer of Care (Signed)
Immediate Anesthesia Transfer of Care Note  Patient: Robert RobertsRobert G Tabak  Procedure(s) Performed: Procedure(s): ESOPHAGOGASTRODUODENOSCOPY (EGD) WITH PROPOFOL (N/A)  Patient Location: PACU  Anesthesia Type:General  Level of Consciousness: responds to stimulation  Airway & Oxygen Therapy: Patient Spontanous Breathing and Patient connected to nasal cannula oxygen  Post-op Assessment: Report given to RN and Post -op Vital signs reviewed and stable  Post vital signs: Reviewed and stable  Last Vitals:  Vitals:   05/19/16 0744 05/19/16 0829  BP: (!) 145/91 93/62  Pulse: (!) 53 (!) 55  Resp: 16 17  Temp: (!) 35.9 C 36.3 C    Last Pain:  Vitals:   05/19/16 0829  TempSrc: Tympanic         Complications: No apparent anesthesia complications

## 2016-05-23 ENCOUNTER — Encounter: Payer: Self-pay | Admitting: Unknown Physician Specialty

## 2016-05-23 LAB — SURGICAL PATHOLOGY

## 2017-01-15 ENCOUNTER — Other Ambulatory Visit
Admission: RE | Admit: 2017-01-15 | Discharge: 2017-01-15 | Disposition: A | Discharge status: Home / Self Care | Payer: BLUE CROSS/BLUE SHIELD | Source: Ambulatory Visit | Attending: Internal Medicine | Admitting: Internal Medicine

## 2017-01-15 DIAGNOSIS — Z0000 Encounter for general adult medical examination without abnormal findings: Secondary | ICD-10-CM | POA: Diagnosis not present

## 2017-01-15 LAB — COMPREHENSIVE METABOLIC PANEL
ALBUMIN: 4.5 g/dL (ref 3.5–5.0)
ALT: 26 U/L (ref 17–63)
AST: 24 U/L (ref 15–41)
Alkaline Phosphatase: 58 U/L (ref 38–126)
Anion gap: 9 (ref 5–15)
BILIRUBIN TOTAL: 1.2 mg/dL (ref 0.3–1.2)
BUN: 21 mg/dL — ABNORMAL HIGH (ref 6–20)
CHLORIDE: 106 mmol/L (ref 101–111)
CO2: 28 mmol/L (ref 22–32)
Calcium: 9.2 mg/dL (ref 8.9–10.3)
Creatinine, Ser: 1.37 mg/dL — ABNORMAL HIGH (ref 0.61–1.24)
GFR calc Af Amer: >60 mL/min (ref >60)
GFR calc non Af Amer: 54 mL/min — ABNORMAL LOW (ref >60)
GLUCOSE: 101 mg/dL — AB (ref 65–99)
POTASSIUM: 3.6 mmol/L (ref 3.5–5.1)
Sodium: 143 mmol/L (ref 135–145)
TOTAL PROTEIN: 7.9 g/dL (ref 6.5–8.1)

## 2017-01-15 LAB — CBC WITH DIFFERENTIAL/PLATELET
BASOS ABS: 0.1 10*3/uL (ref 0–0.1)
BASOS PCT: 1 %
Eosinophils Absolute: 0.3 10*3/uL (ref 0–0.7)
Eosinophils Relative: 4 %
HEMATOCRIT: 46.8 % (ref 40.0–52.0)
Hemoglobin: 15.7 g/dL (ref 13.0–18.0)
Lymphocytes Relative: 34 %
Lymphs Abs: 2.6 10*3/uL (ref 1.0–3.6)
MCH: 30.1 pg (ref 26.0–34.0)
MCHC: 33.6 g/dL (ref 32.0–36.0)
MCV: 89.7 fL (ref 80.0–100.0)
MONO ABS: 0.8 10*3/uL (ref 0.2–1.0)
Monocytes Relative: 10 %
NEUTROS ABS: 4 10*3/uL (ref 1.4–6.5)
NEUTROS PCT: 51 %
Platelets: 248 10*3/uL (ref 150–440)
RBC: 5.22 MIL/uL (ref 4.40–5.90)
RDW: 14.1 % (ref 11.5–14.5)
WBC: 7.6 10*3/uL (ref 3.8–10.6)

## 2017-01-15 LAB — URINALYSIS, COMPLETE (UACMP) WITH MICROSCOPIC
BACTERIA UA: NONE SEEN
Bilirubin Urine: NEGATIVE
GLUCOSE, UA: NEGATIVE mg/dL
KETONES UR: NEGATIVE mg/dL
Leukocytes, UA: NEGATIVE
Nitrite: NEGATIVE
PROTEIN: NEGATIVE mg/dL
SQUAMOUS EPITHELIAL / LPF: NONE SEEN
Specific Gravity, Urine: 1.025 (ref 1.005–1.030)
pH: 5 (ref 5.0–8.0)

## 2017-01-15 LAB — LIPID PANEL
CHOL/HDL RATIO: 3.1 ratio
Cholesterol: 156 mg/dL (ref 0–200)
HDL: 50 mg/dL (ref >40)
LDL Cholesterol: 82 mg/dL (ref 0–99)
Triglycerides: 119 mg/dL (ref <150)
VLDL: 24 mg/dL (ref 0–40)

## 2017-01-15 LAB — VITAMIN B12: VITAMIN B 12: 251 pg/mL (ref 180–914)

## 2017-01-15 LAB — PSA: Prostatic Specific Antigen: 2.71 ng/mL (ref 0.00–4.00)

## 2017-01-16 LAB — THYROID PANEL
Free Thyroxine Index: 1.9 (ref 1.2–4.9)
T3 Uptake Ratio: 23 % — ABNORMAL LOW (ref 24–39)
T4, Total: 8.3 ug/dL (ref 4.5–12.0)

## 2017-01-16 LAB — HEMOGLOBIN A1C
Hgb A1c MFr Bld: 5.5 % (ref 4.8–5.6)
Mean Plasma Glucose: 111.15 mg/dL
